# Patient Record
Sex: Female | Born: 1995 | Race: White | Hispanic: No | Marital: Single | Smoking: Never smoker
Health system: Southern US, Community
[De-identification: ages and names within clinical notes are randomized; demographics above are authoritative.]

## PROBLEM LIST (undated history)

## (undated) DIAGNOSIS — F909 Attention-deficit hyperactivity disorder, unspecified type: Secondary | ICD-10-CM

## (undated) DIAGNOSIS — M47817 Spondylosis without myelopathy or radiculopathy, lumbosacral region: Secondary | ICD-10-CM

## (undated) DIAGNOSIS — M533 Sacrococcygeal disorders, not elsewhere classified: Secondary | ICD-10-CM

## (undated) DIAGNOSIS — S332XXA Dislocation of sacroiliac and sacrococcygeal joint, initial encounter: Secondary | ICD-10-CM

## (undated) DIAGNOSIS — M4317 Spondylolisthesis, lumbosacral region: Secondary | ICD-10-CM

## (undated) HISTORY — PX: ORTHOPEDIC SURGERY: SHX850

## (undated) HISTORY — PX: HERNIA REPAIR: SHX51

---
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
## 2016-08-17 IMAGING — CR DG ELBOW 2V*R*
2 series · 2 of 2 positions shown · non-contrast
Comparison: None.

CLINICAL DATA: Pain.  Status post sacroiliac joint pinning.

EXAM:
RIGHT ELBOW - 2 VIEW

[AP]
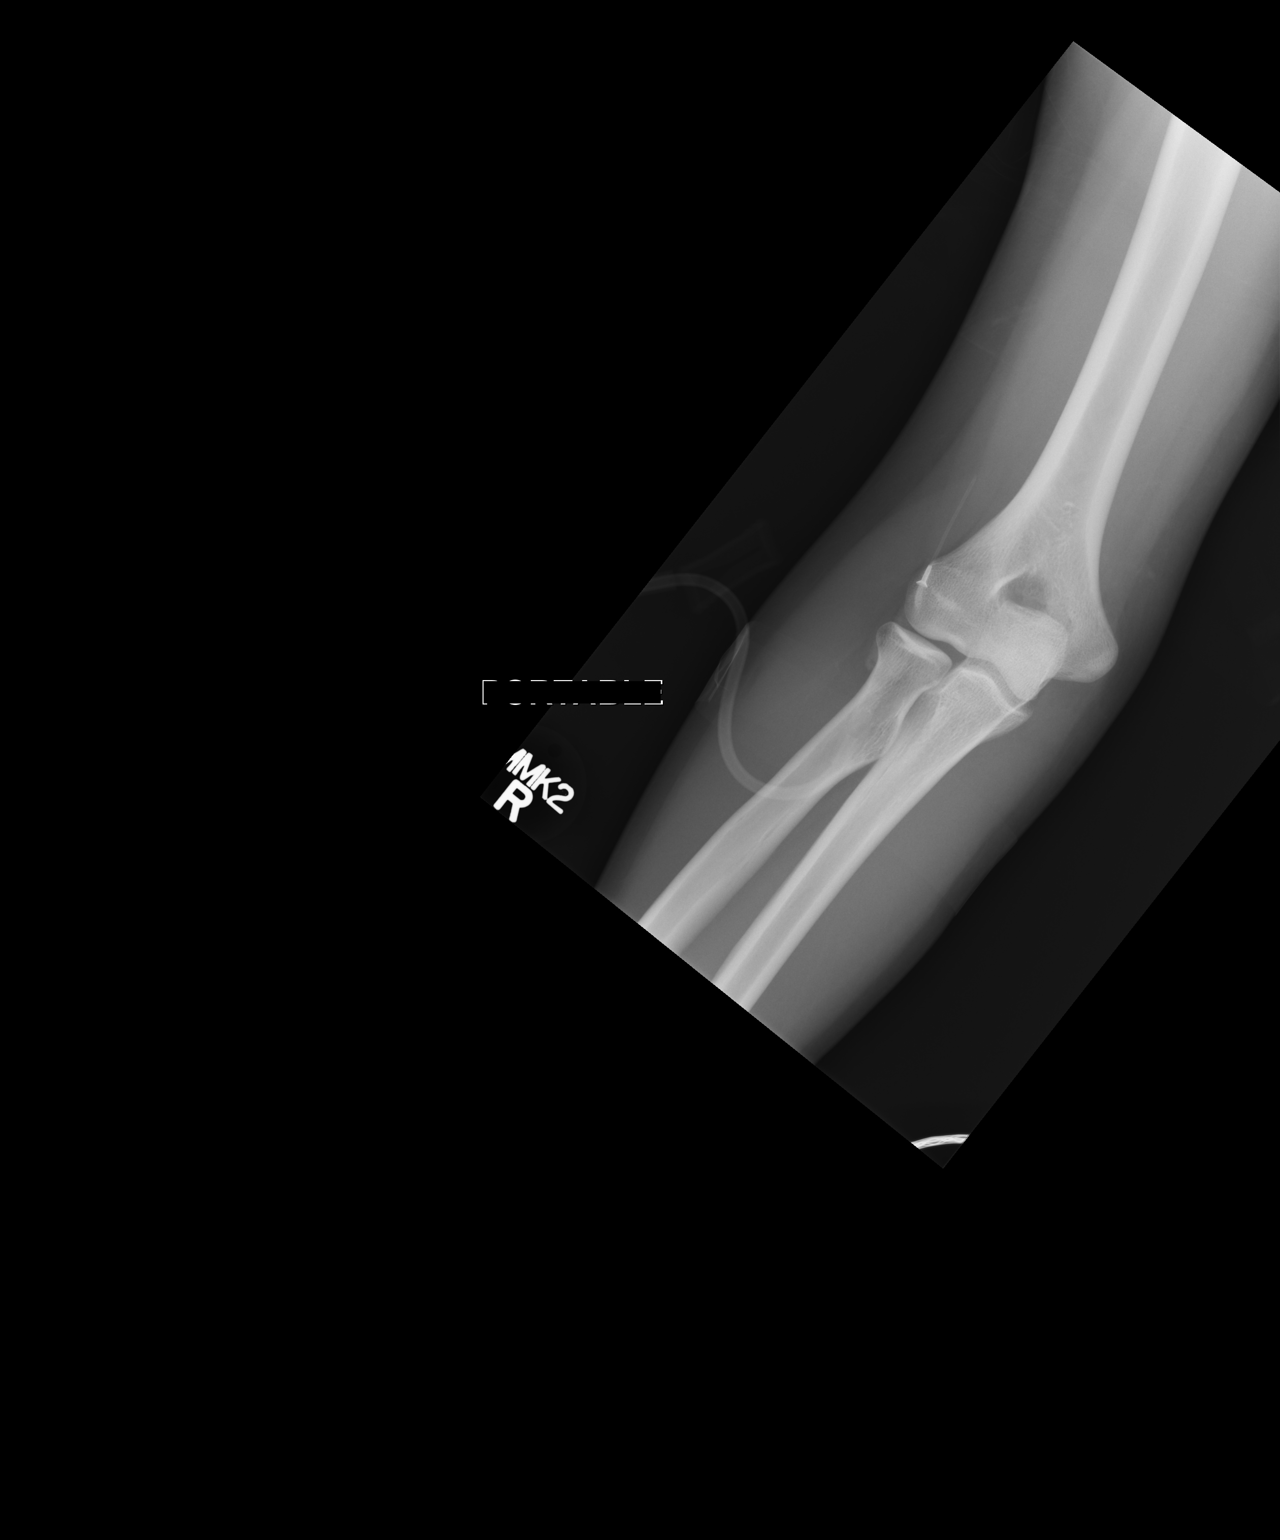

[lateral]
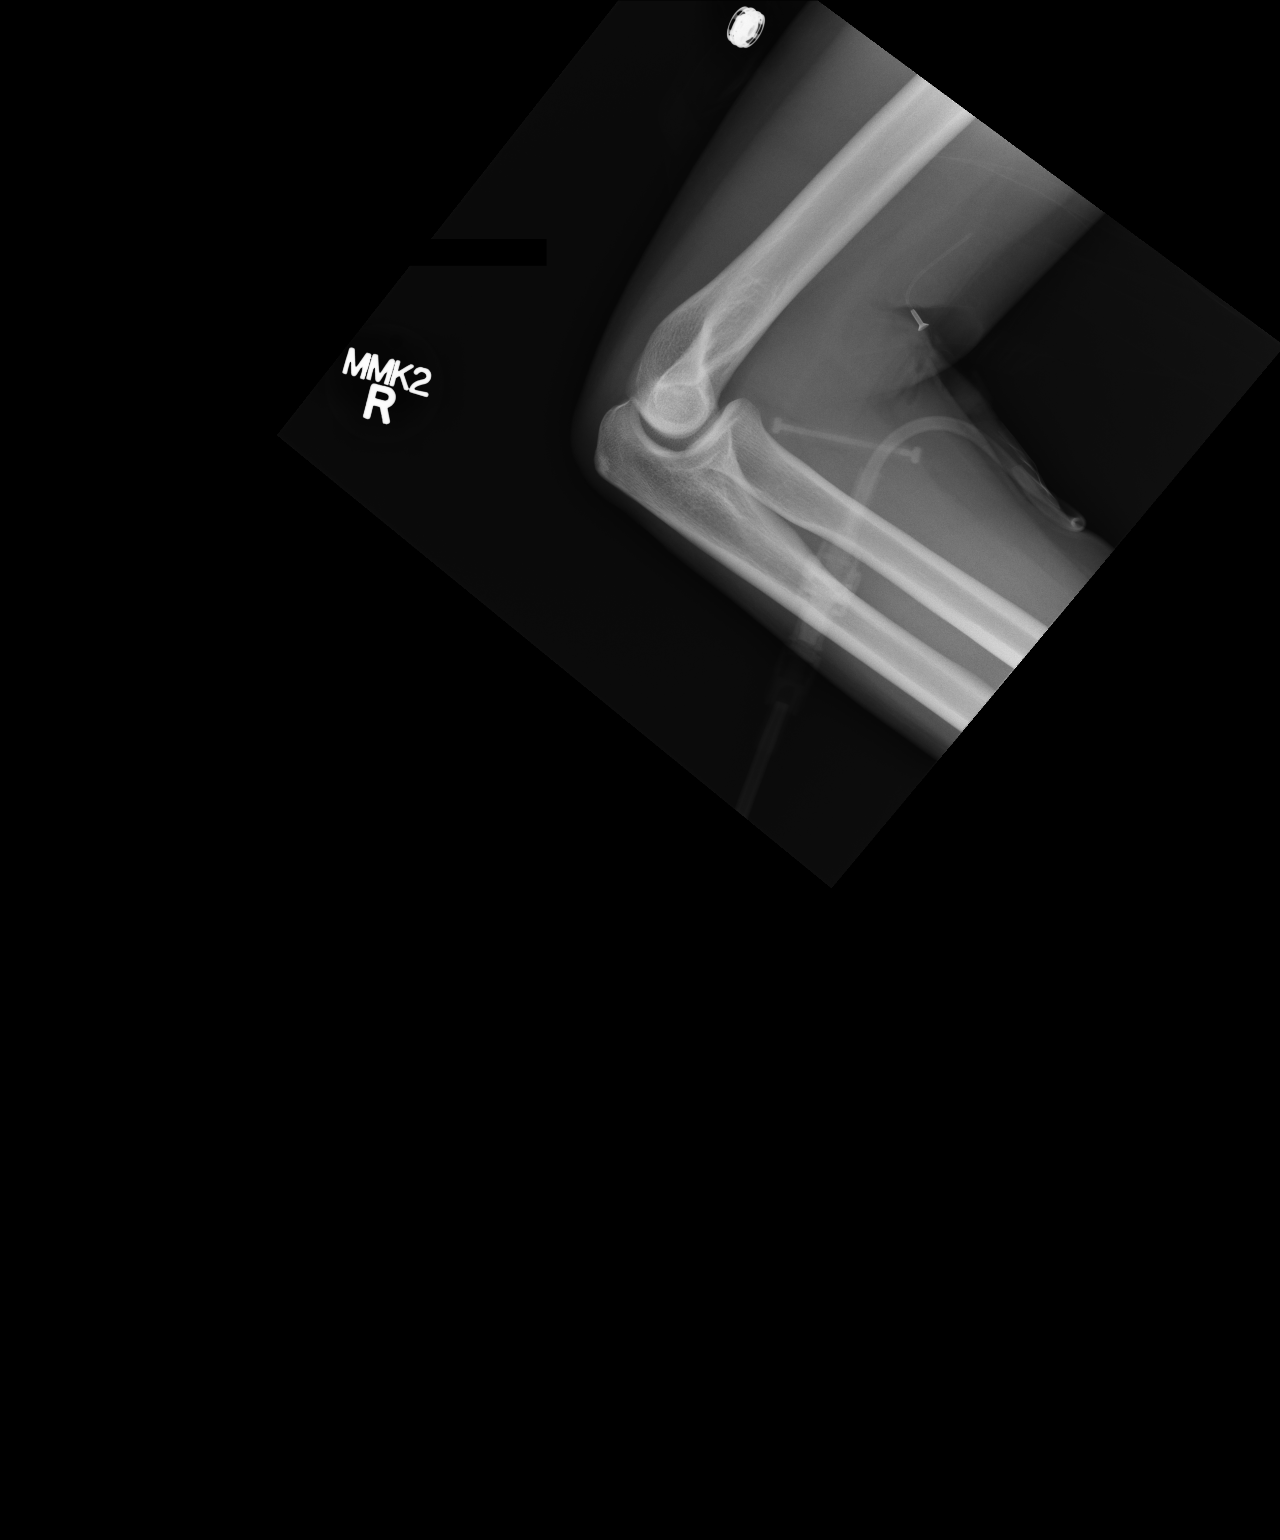

[2 of 2 positions shown; findings below may reference images not displayed]

FINDINGS: There is no evidence of fracture, dislocation, or joint effusion.
There is no evidence of arthropathy or other focal bone abnormality.
Soft tissues are unremarkable.
IMPRESSION: Negative.

## 2016-10-23 ENCOUNTER — Ambulatory Visit: Payer: Medicaid Other | Admitting: Podiatry

## 2016-11-14 ENCOUNTER — Ambulatory Visit (HOSPITAL_COMMUNITY): Payer: BC Managed Care – PPO

## 2016-11-14 ENCOUNTER — Ambulatory Visit (HOSPITAL_COMMUNITY): Payer: BC Managed Care – PPO | Admitting: Anesthesiology

## 2016-11-14 ENCOUNTER — Encounter (HOSPITAL_COMMUNITY): Payer: Self-pay | Admitting: Certified Registered"

## 2016-11-14 ENCOUNTER — Encounter (HOSPITAL_COMMUNITY)
Admission: RE | Disposition: A | Discharge status: Home / Self Care | Payer: Self-pay | Source: Ambulatory Visit | Attending: Orthopedic Surgery

## 2016-11-14 ENCOUNTER — Ambulatory Visit (HOSPITAL_COMMUNITY)
Admission: RE | Admit: 2016-11-14 | Discharge: 2016-11-14 | Disposition: A | Discharge status: Home / Self Care | Payer: BC Managed Care – PPO | Source: Ambulatory Visit | Attending: Orthopedic Surgery | Admitting: Orthopedic Surgery

## 2016-11-14 DIAGNOSIS — F909 Attention-deficit hyperactivity disorder, unspecified type: Secondary | ICD-10-CM | POA: Diagnosis not present

## 2016-11-14 DIAGNOSIS — T8484XA Pain due to internal orthopedic prosthetic devices, implants and grafts, initial encounter: Secondary | ICD-10-CM

## 2016-11-14 DIAGNOSIS — R52 Pain, unspecified: Secondary | ICD-10-CM | POA: Insufficient documentation

## 2016-11-14 DIAGNOSIS — M47817 Spondylosis without myelopathy or radiculopathy, lumbosacral region: Secondary | ICD-10-CM | POA: Diagnosis not present

## 2016-11-14 DIAGNOSIS — Z79899 Other long term (current) drug therapy: Secondary | ICD-10-CM | POA: Diagnosis not present

## 2016-11-14 DIAGNOSIS — Z419 Encounter for procedure for purposes other than remedying health state, unspecified: Secondary | ICD-10-CM

## 2016-11-14 HISTORY — PX: HARDWARE REMOVAL: SHX979

## 2016-11-14 LAB — HCG, SERUM, QUALITATIVE: Preg, Serum: NEGATIVE

## 2016-11-14 SURGERY — REMOVAL, HARDWARE
Anesthesia: General | Laterality: Right

## 2016-11-14 MED ORDER — KETOROLAC TROMETHAMINE 30 MG/ML IJ SOLN
30.0000 mg | Freq: Once | INTRAMUSCULAR | Status: DC
  Filled 2016-11-14: qty 1

## 2016-11-14 MED ORDER — LACTATED RINGERS IV SOLN
INTRAVENOUS | Status: DC
  Administered 2016-11-14: 10:00:00 via INTRAVENOUS

## 2016-11-14 MED ORDER — LIDOCAINE 2% (20 MG/ML) 5 ML SYRINGE
INTRAMUSCULAR | Status: AC
  Filled 2016-11-14: qty 5

## 2016-11-14 MED ORDER — HYDROCODONE-ACETAMINOPHEN 5-325 MG PO TABS: 1.0000 | ORAL_TABLET | Freq: Two times a day (BID) | ORAL | 0 refills | Status: AC | PRN

## 2016-11-14 MED ORDER — CHLORHEXIDINE GLUCONATE 4 % EX LIQD: 60.0000 mL | Freq: Once | CUTANEOUS | Status: DC

## 2016-11-14 MED ORDER — ONDANSETRON HCL 4 MG PO TABS: 4.0000 mg | ORAL_TABLET | Freq: Three times a day (TID) | ORAL | 0 refills | Status: AC | PRN

## 2016-11-14 MED ORDER — LIDOCAINE 2% (20 MG/ML) 5 ML SYRINGE
INTRAMUSCULAR | Status: DC | PRN
  Administered 2016-11-14: 60 mg via INTRAVENOUS

## 2016-11-14 MED ORDER — KETOROLAC TROMETHAMINE 10 MG PO TABS: 10.0000 mg | ORAL_TABLET | Freq: Four times a day (QID) | ORAL | 0 refills | Status: DC | PRN

## 2016-11-14 MED ORDER — DEXAMETHASONE SODIUM PHOSPHATE 10 MG/ML IJ SOLN
INTRAMUSCULAR | Status: DC | PRN
  Administered 2016-11-14: 5 mg via INTRAVENOUS

## 2016-11-14 MED ORDER — MIDAZOLAM HCL 5 MG/5ML IJ SOLN
INTRAMUSCULAR | Status: DC | PRN
Start: 2016-11-14 — End: 2016-11-14
  Administered 2016-11-14: 2 mg via INTRAVENOUS

## 2016-11-14 MED ORDER — MIDAZOLAM HCL 2 MG/2ML IJ SOLN
INTRAMUSCULAR | Status: AC
  Filled 2016-11-14: qty 2

## 2016-11-14 MED ORDER — ONDANSETRON HCL 4 MG/2ML IJ SOLN
INTRAMUSCULAR | Status: AC
  Filled 2016-11-14: qty 2

## 2016-11-14 MED ORDER — PROPOFOL 10 MG/ML IV BOLUS
INTRAVENOUS | Status: DC | PRN
  Administered 2016-11-14: 150 mg via INTRAVENOUS
  Administered 2016-11-14: 50 mg via INTRAVENOUS

## 2016-11-14 MED ORDER — HYDROMORPHONE HCL 1 MG/ML IJ SOLN
INTRAMUSCULAR | Status: AC
  Administered 2016-11-14: 0.25 mg via INTRAVENOUS
  Filled 2016-11-14: qty 1

## 2016-11-14 MED ORDER — PROPOFOL 10 MG/ML IV BOLUS
INTRAVENOUS | Status: AC
  Filled 2016-11-14: qty 20

## 2016-11-14 MED ORDER — KETOROLAC TROMETHAMINE 10 MG PO TABS: 10.0000 mg | ORAL_TABLET | Freq: Four times a day (QID) | ORAL | 0 refills | Status: AC | PRN

## 2016-11-14 MED ORDER — DEXAMETHASONE SODIUM PHOSPHATE 10 MG/ML IJ SOLN
INTRAMUSCULAR | Status: AC
  Filled 2016-11-14: qty 1

## 2016-11-14 MED ORDER — POVIDONE-IODINE 10 % EX SWAB: 2.0000 "application " | Freq: Once | CUTANEOUS | Status: DC

## 2016-11-14 MED ORDER — ONDANSETRON HCL 4 MG/2ML IJ SOLN
INTRAMUSCULAR | Status: DC | PRN
  Administered 2016-11-14: 4 mg via INTRAVENOUS

## 2016-11-14 MED ORDER — ONDANSETRON HCL 4 MG PO TABS: 4.0000 mg | ORAL_TABLET | Freq: Three times a day (TID) | ORAL | 0 refills | Status: DC | PRN

## 2016-11-14 MED ORDER — HYDROCODONE-ACETAMINOPHEN 5-325 MG PO TABS: 1.0000 | ORAL_TABLET | Freq: Two times a day (BID) | ORAL | 0 refills | Status: DC | PRN

## 2016-11-14 MED ORDER — PROMETHAZINE HCL 25 MG/ML IJ SOLN: 6.2500 mg | INTRAMUSCULAR | Status: DC | PRN

## 2016-11-14 MED ORDER — HYDROMORPHONE HCL 1 MG/ML IJ SOLN
0.2500 mg | INTRAMUSCULAR | Status: DC | PRN
  Administered 2016-11-14 (×2): 0.25 mg via INTRAVENOUS

## 2016-11-14 MED ORDER — PHENYLEPHRINE 40 MCG/ML (10ML) SYRINGE FOR IV PUSH (FOR BLOOD PRESSURE SUPPORT)
PREFILLED_SYRINGE | INTRAVENOUS | Status: DC | PRN
  Administered 2016-11-14: 80 ug via INTRAVENOUS

## 2016-11-14 MED ORDER — CEFAZOLIN SODIUM-DEXTROSE 2-4 GM/100ML-% IV SOLN
2.0000 g | INTRAVENOUS | Status: AC
  Administered 2016-11-14: 2 g via INTRAVENOUS
  Filled 2016-11-14: qty 100

## 2016-11-14 MED ORDER — 0.9 % SODIUM CHLORIDE (POUR BTL) OPTIME
TOPICAL | Status: DC | PRN
  Administered 2016-11-14: 1000 mL

## 2016-11-14 MED ORDER — FENTANYL CITRATE (PF) 250 MCG/5ML IJ SOLN
INTRAMUSCULAR | Status: AC
  Filled 2016-11-14: qty 5

## 2016-11-14 MED ORDER — PHENYLEPHRINE 40 MCG/ML (10ML) SYRINGE FOR IV PUSH (FOR BLOOD PRESSURE SUPPORT)
PREFILLED_SYRINGE | INTRAVENOUS | Status: AC
  Filled 2016-11-14: qty 10

## 2016-11-14 SURGICAL SUPPLY — 60 items
BANDAGE ACE 4X5 VEL STRL LF (GAUZE/BANDAGES/DRESSINGS) ×3 IMPLANT
BANDAGE ACE 6X5 VEL STRL LF (GAUZE/BANDAGES/DRESSINGS) ×3 IMPLANT
BANDAGE ESMARK 6X9 LF (GAUZE/BANDAGES/DRESSINGS) ×1 IMPLANT
BNDG CMPR 9X6 STRL LF SNTH (GAUZE/BANDAGES/DRESSINGS) ×1
BNDG COHESIVE 6X5 TAN STRL LF (GAUZE/BANDAGES/DRESSINGS) ×3 IMPLANT
BNDG ESMARK 6X9 LF (GAUZE/BANDAGES/DRESSINGS) ×3
BNDG GAUZE ELAST 4 BULKY (GAUZE/BANDAGES/DRESSINGS) ×6 IMPLANT
BRUSH SCRUB SURG 4.25 DISP (MISCELLANEOUS) ×6 IMPLANT
CLOSURE WOUND 1/2 X4 (GAUZE/BANDAGES/DRESSINGS)
COVER SURGICAL LIGHT HANDLE (MISCELLANEOUS) ×6 IMPLANT
CUFF TOURNIQUET SINGLE 18IN (TOURNIQUET CUFF) IMPLANT
CUFF TOURNIQUET SINGLE 24IN (TOURNIQUET CUFF) IMPLANT
CUFF TOURNIQUET SINGLE 34IN LL (TOURNIQUET CUFF) IMPLANT
DRAPE C-ARM 42X72 X-RAY (DRAPES) IMPLANT
DRAPE C-ARMOR (DRAPES) ×3 IMPLANT
DRAPE U-SHAPE 47X51 STRL (DRAPES) ×3 IMPLANT
DRSG ADAPTIC 3X8 NADH LF (GAUZE/BANDAGES/DRESSINGS) ×3 IMPLANT
DRSG MEPILEX BORDER 4X4 (GAUZE/BANDAGES/DRESSINGS) ×2 IMPLANT
ELECT REM PT RETURN 9FT ADLT (ELECTROSURGICAL) ×3
ELECTRODE REM PT RTRN 9FT ADLT (ELECTROSURGICAL) ×1 IMPLANT
GAUZE SPONGE 4X4 12PLY STRL (GAUZE/BANDAGES/DRESSINGS) ×3 IMPLANT
GLOVE BIO SURGEON STRL SZ7.5 (GLOVE) ×3 IMPLANT
GLOVE BIO SURGEON STRL SZ8 (GLOVE) ×3 IMPLANT
GLOVE BIOGEL PI IND STRL 7.5 (GLOVE) ×1 IMPLANT
GLOVE BIOGEL PI IND STRL 8 (GLOVE) ×1 IMPLANT
GLOVE BIOGEL PI INDICATOR 7.5 (GLOVE) ×2
GLOVE BIOGEL PI INDICATOR 8 (GLOVE) ×2
GOWN STRL REUS W/ TWL LRG LVL3 (GOWN DISPOSABLE) ×2 IMPLANT
GOWN STRL REUS W/ TWL XL LVL3 (GOWN DISPOSABLE) ×1 IMPLANT
GOWN STRL REUS W/TWL LRG LVL3 (GOWN DISPOSABLE) ×6
GOWN STRL REUS W/TWL XL LVL3 (GOWN DISPOSABLE) ×3
GUIDEWIRE THREADED 2.8MM (WIRE) ×2 IMPLANT
KIT BASIN OR (CUSTOM PROCEDURE TRAY) ×3 IMPLANT
KIT ROOM TURNOVER OR (KITS) ×3 IMPLANT
MANIFOLD NEPTUNE II (INSTRUMENTS) ×3 IMPLANT
NEEDLE 22X1 1/2 (OR ONLY) (NEEDLE) IMPLANT
NS IRRIG 1000ML POUR BTL (IV SOLUTION) ×3 IMPLANT
PACK ORTHO EXTREMITY (CUSTOM PROCEDURE TRAY) ×3 IMPLANT
PAD ARMBOARD 7.5X6 YLW CONV (MISCELLANEOUS) ×6 IMPLANT
PADDING CAST COTTON 6X4 STRL (CAST SUPPLIES) ×9 IMPLANT
SPONGE LAP 18X18 X RAY DECT (DISPOSABLE) ×3 IMPLANT
STAPLER VISISTAT 35W (STAPLE) IMPLANT
STOCKINETTE IMPERVIOUS LG (DRAPES) ×3 IMPLANT
STRIP CLOSURE SKIN 1/2X4 (GAUZE/BANDAGES/DRESSINGS) IMPLANT
SUCTION FRAZIER HANDLE 10FR (MISCELLANEOUS)
SUCTION TUBE FRAZIER 10FR DISP (MISCELLANEOUS) IMPLANT
SUT ETHILON 3 0 PS 1 (SUTURE) IMPLANT
SUT PDS AB 2-0 CT1 27 (SUTURE) IMPLANT
SUT VIC AB 0 CT1 27 (SUTURE)
SUT VIC AB 0 CT1 27XBRD ANBCTR (SUTURE) IMPLANT
SUT VIC AB 2-0 CT1 27 (SUTURE)
SUT VIC AB 2-0 CT1 TAPERPNT 27 (SUTURE) IMPLANT
SYR CONTROL 10ML LL (SYRINGE) IMPLANT
TOWEL OR 17X24 6PK STRL BLUE (TOWEL DISPOSABLE) ×6 IMPLANT
TOWEL OR 17X26 10 PK STRL BLUE (TOWEL DISPOSABLE) ×6 IMPLANT
TUBE CONNECTING 12'X1/4 (SUCTIONS) ×1
TUBE CONNECTING 12X1/4 (SUCTIONS) ×2 IMPLANT
UNDERPAD 30X30 (UNDERPADS AND DIAPERS) ×3 IMPLANT
WATER STERILE IRR 1000ML POUR (IV SOLUTION) ×6 IMPLANT
YANKAUER SUCT BULB TIP NO VENT (SUCTIONS) ×3 IMPLANT

## 2016-11-14 NOTE — Anesthesia Preprocedure Evaluation (Addendum)
Anesthesia Evaluation  Patient identified by MRN, date of birth, ID band Patient awake    Reviewed: Allergy & Precautions, H&P , NPO status , Patient's Chart, lab work & pertinent test results  Airway Mallampati: II  TM Distance: >3 FB Neck ROM: Full    Dental no notable dental hx. (+) Teeth Intact, Dental Advisory Given   Pulmonary neg pulmonary ROS,    Pulmonary exam normal breath sounds clear to auscultation       Cardiovascular negative cardio ROS   Rhythm:Regular Rate:Normal     Neuro/Psych negative neurological ROS  negative psych ROS   GI/Hepatic negative GI ROS, Neg liver ROS,   Endo/Other  negative endocrine ROS  Renal/GU negative Renal ROS  negative genitourinary   Musculoskeletal   Abdominal   Peds  (+) ADHD Hematology negative hematology ROS (+)   Anesthesia Other Findings   Reproductive/Obstetrics negative OB ROS                             Anesthesia Physical  Anesthesia Plan  ASA: I  Anesthesia Plan: General   Post-op Pain Management:    Induction: Intravenous  PONV Risk Score and Plan: 3 and Ondansetron, Dexamethasone and Midazolam  Airway Management Planned: LMA  Additional Equipment:   Intra-op Plan:   Post-operative Plan: Extubation in OR  Informed Consent: I have reviewed the patients History and Physical, chart, labs and discussed the procedure including the risks, benefits and alternatives for the proposed anesthesia with the patient or authorized representative who has indicated his/her understanding and acceptance.   Dental advisory given  Plan Discussed with: CRNA, Anesthesiologist and Surgeon  Anesthesia Plan Comments:      Anesthesia Quick Evaluation

## 2016-11-14 NOTE — Transfer of Care (Signed)
Immediate Anesthesia Transfer of Care Note  Patient: Alicia Marshall  Procedure(s) Performed: Procedure(s): HARDWARE REMOVAL SACRO-ILIAC SCREW RIGHT SIDE (Right)  Patient Location: PACU  Anesthesia Type:General  Level of Consciousness: drowsy  Airway & Oxygen Therapy: Patient Spontanous Breathing and Patient connected to nasal cannula oxygen  Post-op Assessment: Report given to RN, Post -op Vital signs reviewed and stable and Patient moving all extremities  Post vital signs: Reviewed and stable  Last Vitals:  Vitals:   11/14/16 0919 11/14/16 1112  BP: 112/80   Pulse: 65   Resp: 18   Temp: 36.4 C (!) (P) 36.3 C    Last Pain:  Vitals:   11/14/16 1112  TempSrc:   PainSc: (P) 0-No pain      Patients Stated Pain Goal: 0 (11/14/16 0932)  Complications: No apparent anesthesia complications

## 2016-11-14 NOTE — H&P (Signed)
[facility] H&P/Consult     Chief Complaint: Retained, symptomatic right sacroiliac screw HPI: Alicia Marshall is an 21 y.o. female.s/p repair of pelvic ring instability. Requests removal to reduce pain and later arthrosis.   Past Medical History:  Diagnosis Date  . ADHD (attention deficit hyperactivity disorder)   . Attention deficit hyperactivity disorder (ADHD) 03/05/2015  . Disorder of SI (sacroiliac) joint, Right  03/05/2015  . Lumbosacral spondylosis without myelopathy 03/05/2015  . SI joint dislocation, right 03/05/2015  . Spondylolisthesis at L5-S1 level 03/05/2015    Past Surgical History:  Procedure Laterality Date  . HERNIA REPAIR    . HERNIA REPAIR    . ORTHOPEDIC SURGERY     bunion surgery   . SACRO-ILIAC PINNING Right 03/04/2015   Procedure: Loyal GamblerSACRO-ILIAC PINNING;  Surgeon: Myrene GalasMichael Lycan Davee, MD;  [facility];  Service: Orthopedics;  Laterality: Right;    History reviewed. No pertinent family history. Social History:  reports that she has never smoked. She has never used smokeless tobacco. She reports that she does not drink alcohol or use drugs.  Allergies: No Known Allergies  Medications Prior to Admission  Medication Sig Dispense Refill  . cloNIDine (CATAPRES) 0.1 MG tablet Take 0.2 mg by mouth at bedtime.  2  . methylphenidate 36 MG PO CR tablet Take 72 mg by mouth daily.                        No results found for this or any previous visit (from the past 48 hour(s)). No results found.  ROS No recent fever, bleeding abnormalities, urologic dysfunction, GI problems, or weight gain.  Blood pressure 112/80, pulse 65, temperature 97.6 F (36.4 C), temperature source Oral, resp. rate 18, height 5\' 2"  (1.575 m), weight 51.7 kg (114 lb), SpO2 100 %. Physical Exam NCAT RRR CTA S/NT/ND R&LLE   No edema/ swelling controlled  Sens: DPN, SPN, TN intact  Motor: EHL, FHL, and lessor toe ext and flex all intact  DP  2+   Assessment/Plan Retained, symptomatic right sacroiliac screw  I discussed with the patient the risks and benefits of surgery, including the possibility of infection, nerve injury, vessel injury, wound breakdown, arthritis, failure to alleviate symptoms, DVT/ PE, loss of motion, and need for further surgery among others.  She acknowledged these risks and wished to proceed.    Myrene GalasMichael Angelica Frandsen, MD [facility] [phone] (p)  11/14/2016, 9:54 AM

## 2016-11-14 NOTE — Anesthesia Procedure Notes (Signed)
Procedure Name: LMA Insertion Date/Time: 11/14/2016 10:27 AM Performed by: Charm BargesBUTLER, Vannia Pola R Pre-anesthesia Checklist: Patient identified, Emergency Drugs available, Suction available and Patient being monitored Patient Re-evaluated:Patient Re-evaluated prior to induction Oxygen Delivery Method: Circle System Utilized Preoxygenation: Pre-oxygenation with 100% oxygen Induction Type: IV induction Ventilation: Mask ventilation without difficulty LMA: LMA inserted LMA Size: 4.0 Number of attempts: 1 Placement Confirmation: positive ETCO2 Tube secured with: Tape Dental Injury: Teeth and Oropharynx as per pre-operative assessment

## 2016-11-14 NOTE — Anesthesia Postprocedure Evaluation (Signed)
Anesthesia Post Note  Patient: Alicia Marshall  Procedure(s) Performed: Procedure(s) (LRB): HARDWARE REMOVAL SACRO-ILIAC SCREW RIGHT SIDE (Right)     Patient location during evaluation: PACU Anesthesia Type: General Level of consciousness: awake and alert Pain management: pain level controlled Vital Signs Assessment: post-procedure vital signs reviewed and stable Respiratory status: spontaneous breathing, nonlabored ventilation, respiratory function stable and patient connected to nasal cannula oxygen Cardiovascular status: blood pressure returned to baseline and stable Postop Assessment: no signs of nausea or vomiting Anesthetic complications: no    Last Vitals:  Vitals:   11/14/16 1131 11/14/16 1139  BP: (!) 119/92 (!) 120/99  Pulse: 85 76  Resp: 17 16  Temp:      Last Pain:  Vitals:   11/14/16 1141  TempSrc:   PainSc: 5                  Kennieth RadFitzgerald, Batoul Limes E

## 2016-11-14 NOTE — Discharge Instructions (Signed)
[facility] Discharge Instructions   General Discharge Instructions  WEIGHT BEARING STATUS: weightbearing as tolerated   RANGE OF MOTION/ACTIVITY:range of motion as tolerated  Wound Care: change dressing on 11/16/2016. Can leave uncovered once there is no drainage. Ok to shower   PAIN MEDICATION USE AND EXPECTATIONS  You have likely been given narcotic medications to help control your pain.  After a traumatic event that results in an fracture (broken bone) with or without surgery, it is ok to use narcotic pain medications to help control one's pain.  We understand that everyone responds to pain differently and each individual patient will be evaluated on a regular basis for the continued need for narcotic medications. Ideally, narcotic medication use should last no more than 6-8 weeks (coinciding with fracture healing).   As a patient it is your responsibility as well to monitor narcotic medication use and report the amount and frequency you use these medications when you come to your office visit.   We would also advise that if you are using narcotic medications, you should take a dose prior to therapy to maximize you participation.  IF YOU ARE ON NARCOTIC MEDICATIONS IT IS NOT PERMISSIBLE TO OPERATE A MOTOR VEHICLE (MOTORCYCLE/CAR/TRUCK/MOPED) OR HEAVY MACHINERY DO NOT MIX NARCOTICS WITH OTHER CNS (CENTRAL NERVOUS SYSTEM) DEPRESSANTS SUCH AS ALCOHOL  Diet: as you were eating previously.  Can use over the counter stool softeners and bowel preparations, such as Miralax, to help with bowel movements.  Narcotics can be constipating.  Be sure to drink plenty of fluids    STOP SMOKING OR USING NICOTINE PRODUCTS!!!!  As discussed nicotine severely impairs your body's ability to heal surgical and traumatic wounds but also impairs bone healing.  Wounds and bone heal by forming microscopic blood vessels (angiogenesis) and nicotine is a vasoconstrictor (essentially, shrinks blood  vessels).  Therefore, if vasoconstriction occurs to these microscopic blood vessels they essentially disappear and are unable to deliver necessary nutrients to the healing tissue.  This is one modifiable factor that you can do to dramatically increase your chances of healing your injury.    (This means no smoking, no nicotine gum, patches, etc)  DO NOT USE NONSTEROIDAL ANTI-INFLAMMATORY DRUGS (NSAID'S)  Using products such as Advil (ibuprofen), Aleve (naproxen), Motrin (ibuprofen) for additional pain control during fracture healing can delay and/or prevent the healing response.  If you would like to take over the counter (OTC) medication, Tylenol (acetaminophen) is ok.  However, some narcotic medications that are given for pain control contain acetaminophen as well. Therefore, you should not exceed more than 4000 mg of tylenol in a day if you do not have liver disease.  Also note that there are may OTC medicines, such as cold medicines and allergy medicines that my contain tylenol as well.  If you have any questions about medications and/or interactions please ask your doctor/PA or your pharmacist.      ICE AND ELEVATE INJURED/OPERATIVE EXTREMITY  Using ice and elevating the injured extremity above your heart can help with swelling and pain control.  Icing in a pulsatile fashion, such as 20 minutes on and 20 minutes off, can be followed.    Do not place ice directly on skin. Make sure there is a barrier between to skin and the ice pack.    Using frozen items such as frozen peas works well as the conform nicely to the are that needs to be iced.  USE AN ACE WRAP OR TED HOSE FOR SWELLING CONTROL  In addition to icing and elevation, Ace wraps or TED hose are used to help limit and resolve swelling.  It is recommended to use Ace wraps or TED hose until you are informed to stop.    When using Ace Wraps start the wrapping distally (farthest away from the body) and wrap proximally (closer to the  body)   Example: If you had surgery on your leg or thing and you do not have a splint on, start the ace wrap at the toes and work your way up to the thigh        If you had surgery on your upper extremity and do not have a splint on, start the ace wrap at your fingers and work your way up to the upper arm  IF YOU ARE IN A SPLINT OR CAST DO NOT REMOVE IT FOR ANY REASON   If your splint gets wet for any reason please contact the office immediately. You may shower in your splint or cast as long as you keep it dry.  This can be done by wrapping in a cast cover or garbage back (or similar)  Do Not stick any thing down your splint or cast such as pencils, money, or hangers to try and scratch yourself with.  If you feel itchy take benadryl as prescribed on the bottle for itching  IF YOU ARE IN A CAM BOOT (BLACK BOOT)  You may remove boot periodically. Perform daily dressing changes as noted below.  Wash the liner of the boot regularly and wear a sock when wearing the boot. It is recommended that you sleep in the boot until told otherwise  CALL THE OFFICE WITH ANY QUESTIONS OR CONCERNS: [phone]        Discharge Pin Site Instructions  Dress pins daily with Kerlix roll starting on POD 2. Wrap the Kerlix so that it tamps the skin down around the pin-skin interface to prevent/limit motion of the skin relative to the pin.  (Pin-skin motion is the primary cause of pain and infection related to external fixator pin sites).  Remove any crust or coagulum that may obstruct drainage with a saline moistened gauze or soap and water.  After POD 3, if there is no discernable drainage on the pin site dressing, the interval for change can by increased to every other day.  You may shower with the fixator, cleaning all pin sites gently with soap and water.  If you have a surgical wound this needs to be completely dry and without drainage before showering.  The extremity can be lifted by the fixator to  facilitate wound care and transfers.  Notify the office/Doctor if you experience increasing drainage, redness, or pain from a pin site, or if you notice purulent (thick, snot-like) drainage.  Discharge Wound Care Instructions  Do NOT apply any ointments, solutions or lotions to pin sites or surgical wounds.  These prevent needed drainage and even though solutions like hydrogen peroxide kill bacteria, they also damage cells lining the pin sites that help fight infection.  Applying lotions or ointments can keep the wounds moist and can cause them to breakdown and open up as well. This can increase the risk for infection. When in doubt call the office.  Surgical incisions should be dressed daily.  If any drainage is noted, use one layer of adaptic, then gauze, Kerlix, and an ace wrap.  Once the incision is completely dry and without drainage, it may be left open to air out.  Showering  may begin 36-48 hours later.  Cleaning gently with soap and water.  Traumatic wounds should be dressed daily as well.    One layer of adaptic, gauze, Kerlix, then ace wrap.  The adaptic can be discontinued once the draining has ceased    If you have a wet to dry dressing: wet the gauze with saline the squeeze as much saline out so the gauze is moist (not soaking wet), place moistened gauze over wound, then place a dry gauze over the moist one, followed by Kerlix wrap, then ace wrap.

## 2016-11-14 NOTE — Brief Op Note (Signed)
11/14/2016  11:10 AM  PATIENT:  Alicia Marshall  21 y.o. female  PRE-OPERATIVE DIAGNOSIS:  Symptomatic right sacroiliac screw  POST-OPERATIVE DIAGNOSIS:  Symptomatic right sacroiliac screw  PROCEDURE:  Procedure(s): HARDWARE REMOVAL SACRO-ILIAC SCREW RIGHT SIDE (Right)  SURGEON:  Surgeon(s) and Role:    Myrene Galas* Crystelle Ferrufino, MD - Primary  PHYSICIAN ASSISTANT: none  ANESTHESIA:   none  EBL:  Total I/O In: 500 [I.V.:500] Out: 5 [Blood:5]  BLOOD ADMINISTERED:none  DRAINS: none   LOCAL MEDICATIONS USED:  NONE  SPECIMEN:  No Specimen  DISPOSITION OF SPECIMEN:  N/A  COUNTS:  YES  TOURNIQUET:  * No tourniquets in log *  DICTATION: .Other Dictation: Dictation Number [id number]  PLAN OF CARE: Discharge to home after PACU  PATIENT DISPOSITION:  PACU - hemodynamically stable.   Delay start of Pharmacological VTE agent (>24hrs) due to surgical blood loss or risk of bleeding: no

## 2016-11-15 ENCOUNTER — Encounter (HOSPITAL_COMMUNITY): Payer: Self-pay | Admitting: Orthopedic Surgery

## 2016-11-15 NOTE — Op Note (Signed)
NAME:  Alicia Marshall, Achaia                     ACCOUNT NO.:  MEDICAL RECORD NO.:  [id number]  LOCATION:                                 FACILITY:  PHYSICIAN:  Doralee AlbinoMichael H. Carola FrostHandy, M.D.      DATE OF BIRTH:  DATE OF PROCEDURE:  11/14/2016 DATE OF DISCHARGE:                              OPERATIVE REPORT   PREOPERATIVE DIAGNOSIS:  Symptomatic hardware, right sacroiliac screw.  POSTOPERATIVE DIAGNOSIS:  Symptomatic hardware, right sacroiliac screw.  PROCEDURE:  Removal of right SI screw.  SURGEON:  Doralee AlbinoMichael H. Carola FrostHandy, M.D.  ASSISTANT:  None.  ANESTHESIA:  General.  COMPLICATIONS:  None.  DISPOSITION:  To PACU.  CONDITION:  Stable.  BRIEF SUMMARY OF INDICATIONS FOR PROCEDURE:  Alicia ShanHannah Sellen is a very pleasant 21 year old female with an unstable pelvic ring fracture, undergone right SI screw fixation.  She had some right-sided complaints following healing and we discussed removal of the screw prior to either breakage of bone overgrowth with the risks including infection, nerve injury, vessel injury, failure to alleviate symptoms, need for further surgery among others.  She acknowledged these risks and strongly wished to proceed.  BRIEF SUMMARY OF PROCEDURE:  Dahlia ClientHannah was taken to the operating room where general anesthesia was induced.  She was positioned supine on a radiolucent table.  A towel bump was placed under her sacrum given her thin build.  After a chlorhexidine scrub, Betadine scrub and paint, the C-arm was brought in.  The old incision was remade.  The guidewire was advanced down to the level of the screw and then into the cannulated portion with x-ray assistance.  The screw was then withdrawn without complication.  Final image showed no change in position of the washer as anticipated.  Wound was irrigated thoroughly and then closed with 2 simple 3-0 nylon sutures.  The patient was taken to PACU in stable condition after application of sterile gently compressive  dressing.  PROGNOSIS:  Dahlia ClientHannah will be weightbearing as tolerated with unrestricted weightbearing and activity in a graduated fashion.  We will plan to see her back in 10 to 14 days for removal of her sutures.     Doralee AlbinoMichael H. Carola FrostHandy, M.D.     M[facility]/MEDQ  D:  11/14/2016  T:  11/14/2016  Job:  [id number]

## 2017-02-01 IMAGING — DX DG CHEST 2V
2 series · 2 of 2 positions shown · non-contrast
Comparison: 06/19/2009

CLINICAL DATA: Chest pain and tightness since 8 p.m..  Nonsmoker.

EXAM:
CHEST  2 VIEW

[w chest pa]
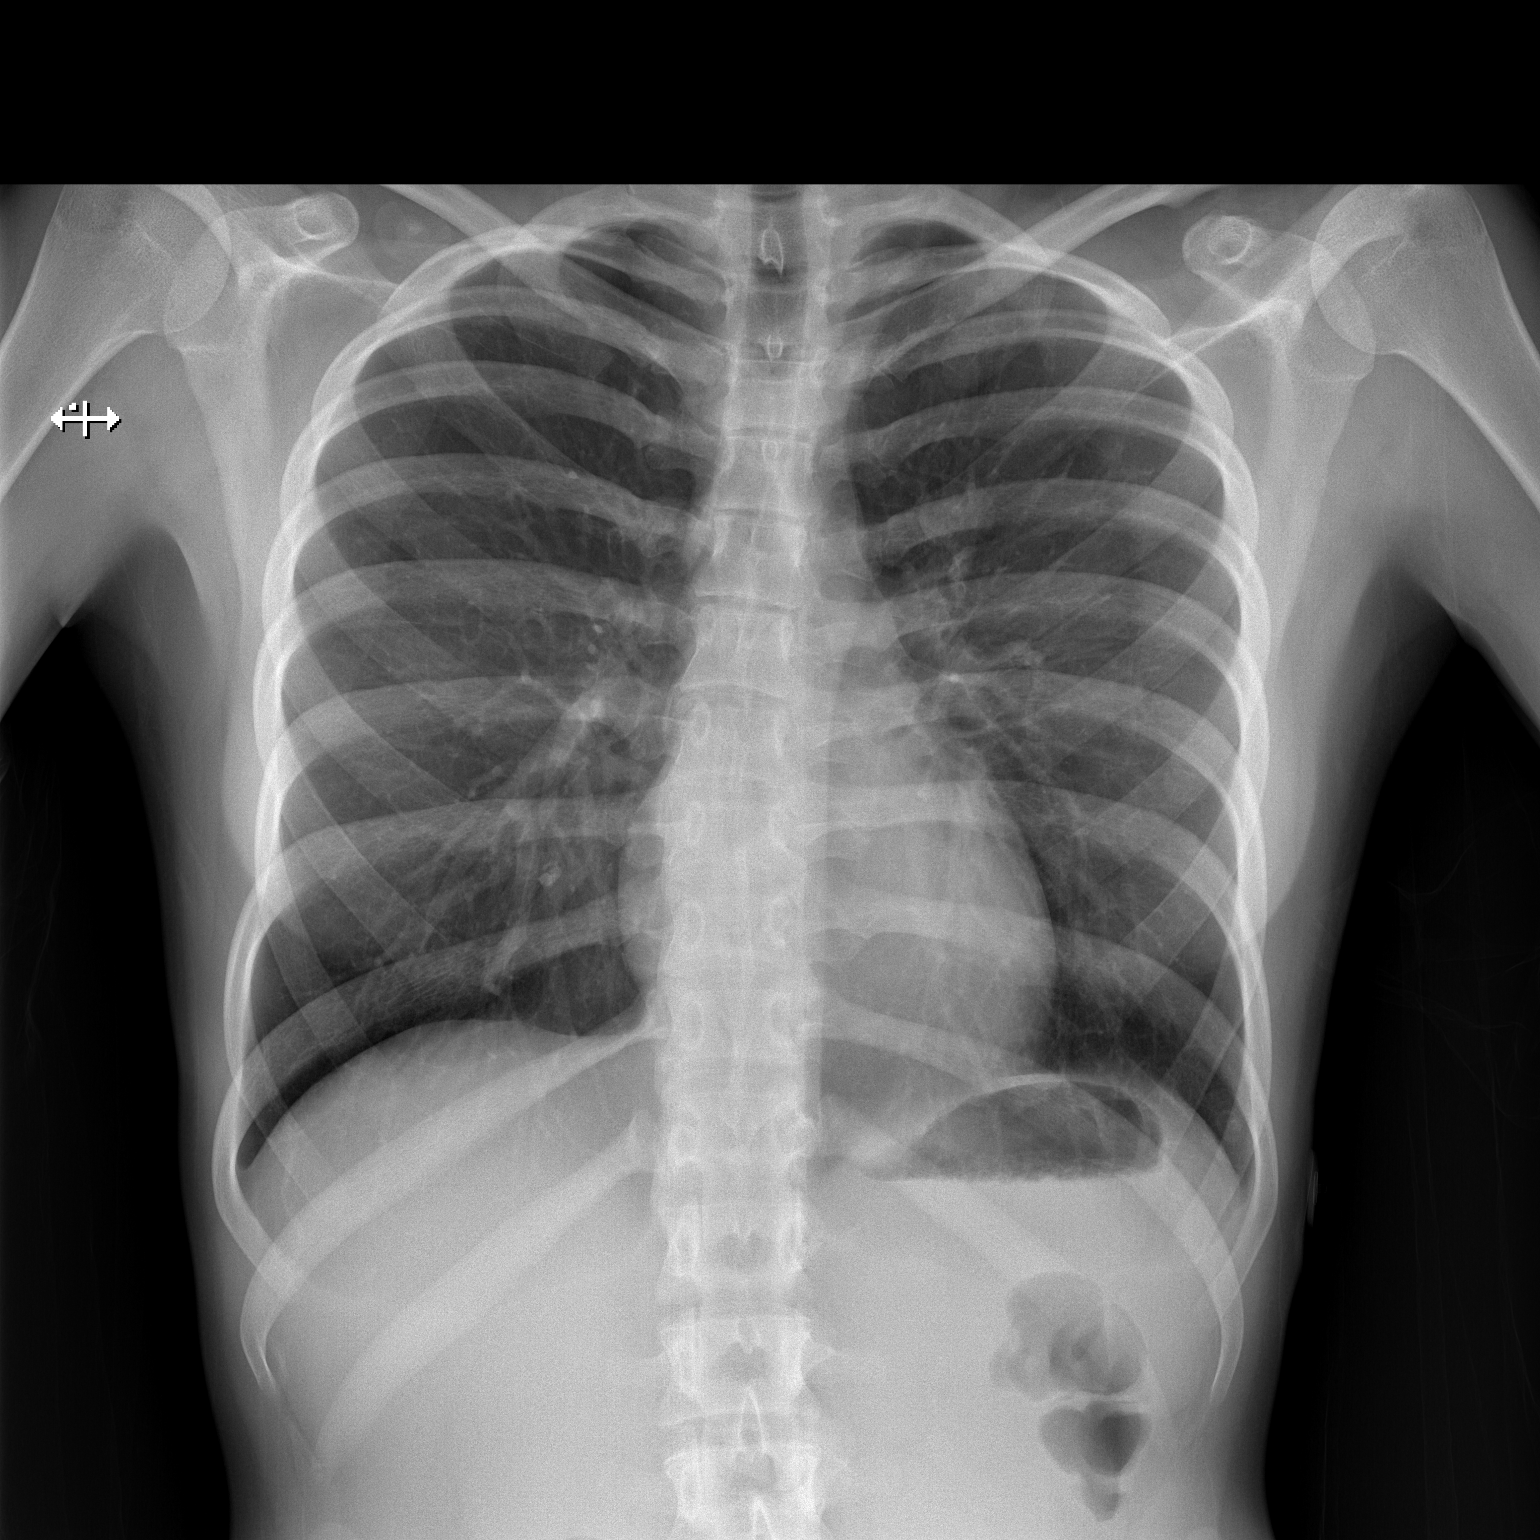

[w chest lat]
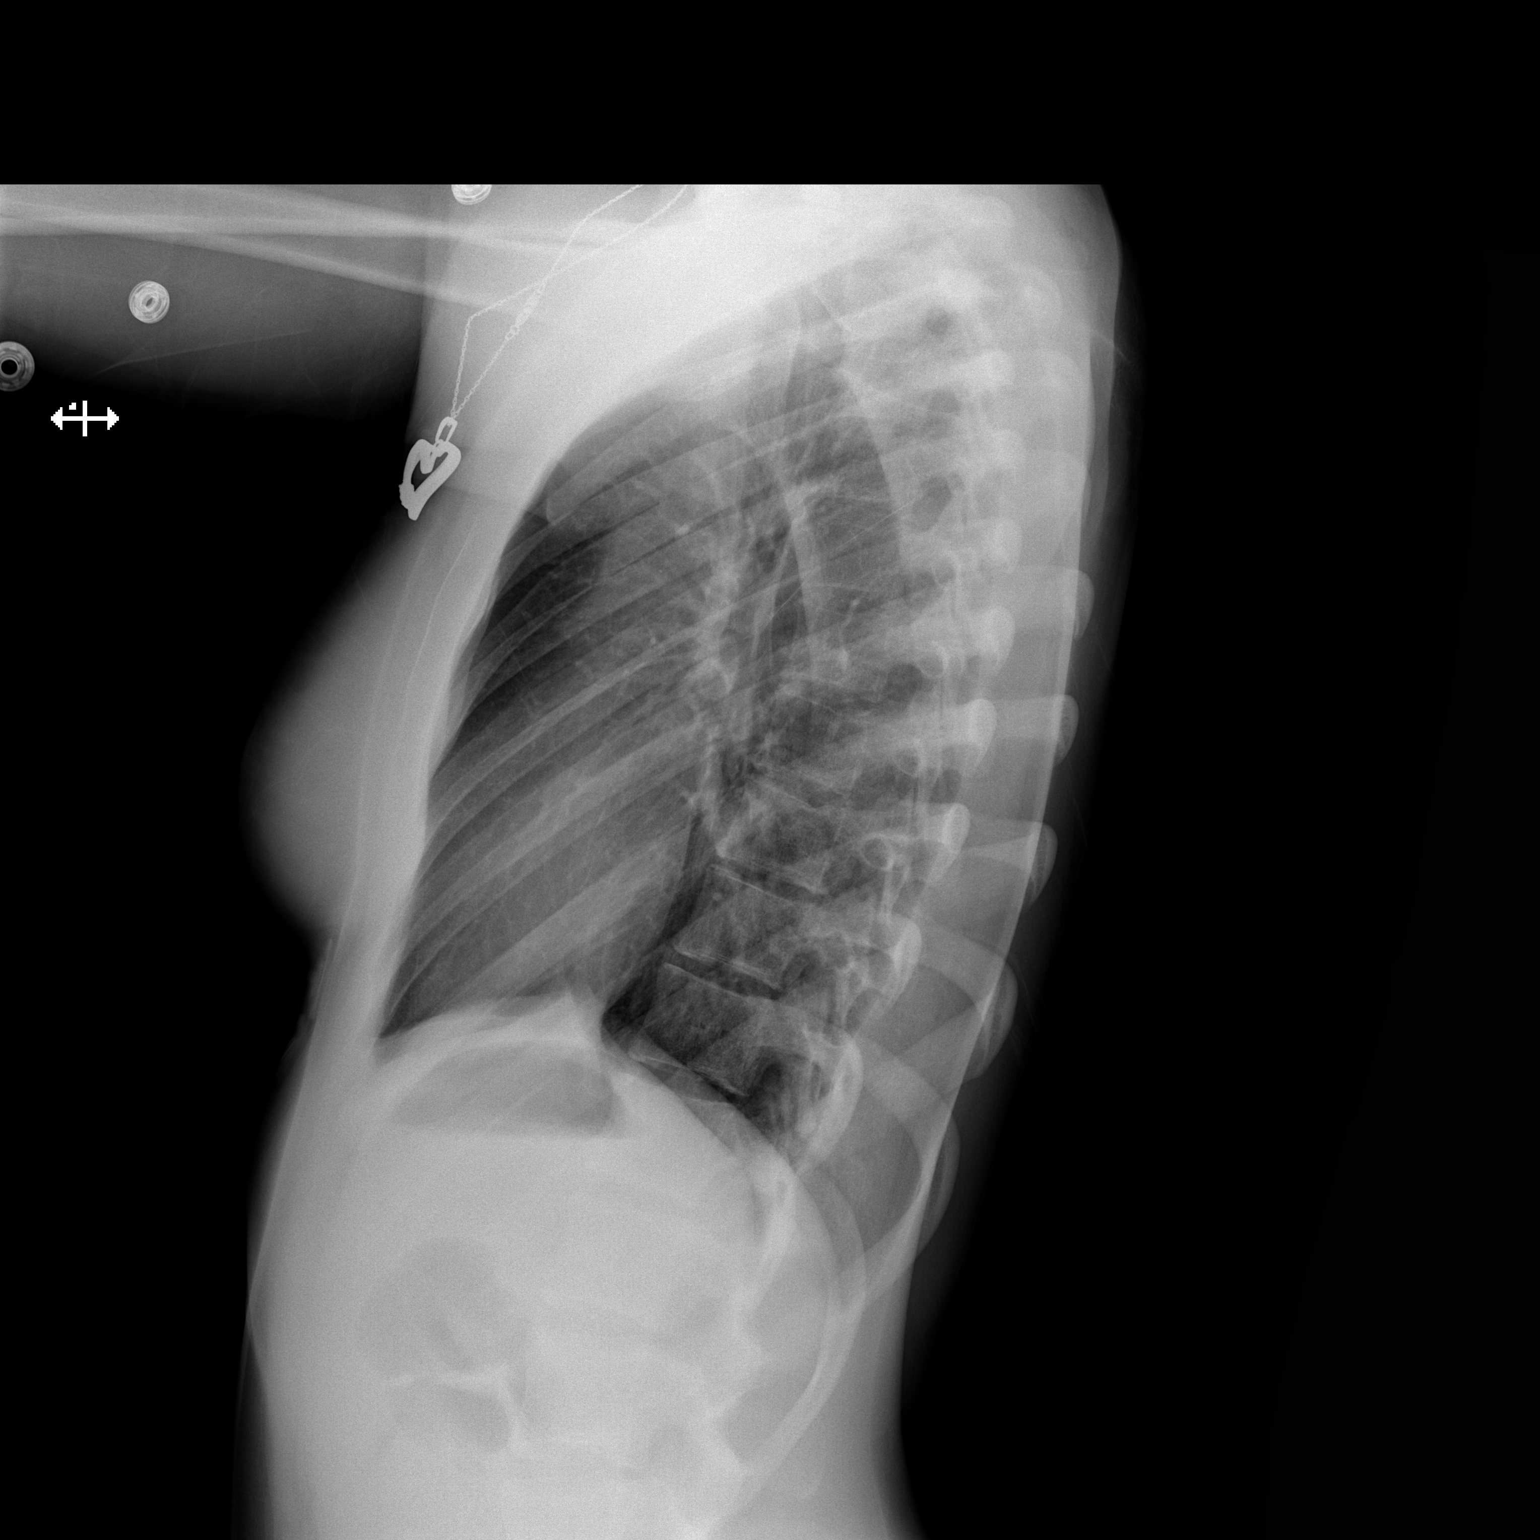

[2 of 2 positions shown; findings below may reference images not displayed]

FINDINGS: The heart size and mediastinal contours are within normal limits.
Both lungs are clear. The visualized skeletal structures are
unremarkable.
IMPRESSION: No active cardiopulmonary disease.

## 2018-04-30 IMAGING — RF DG C-ARM 61-120 MIN
1 series · 1 of 1 positions shown · non-contrast
Comparison: Pelvis 03/04/2015

CLINICAL DATA: Hardware removal

EXAM:
DG C-ARM 61-120 MIN; PELVIS - 1-2 VIEW

[Series 1: run · 1 of 1 slices shown]
[im 1/1]
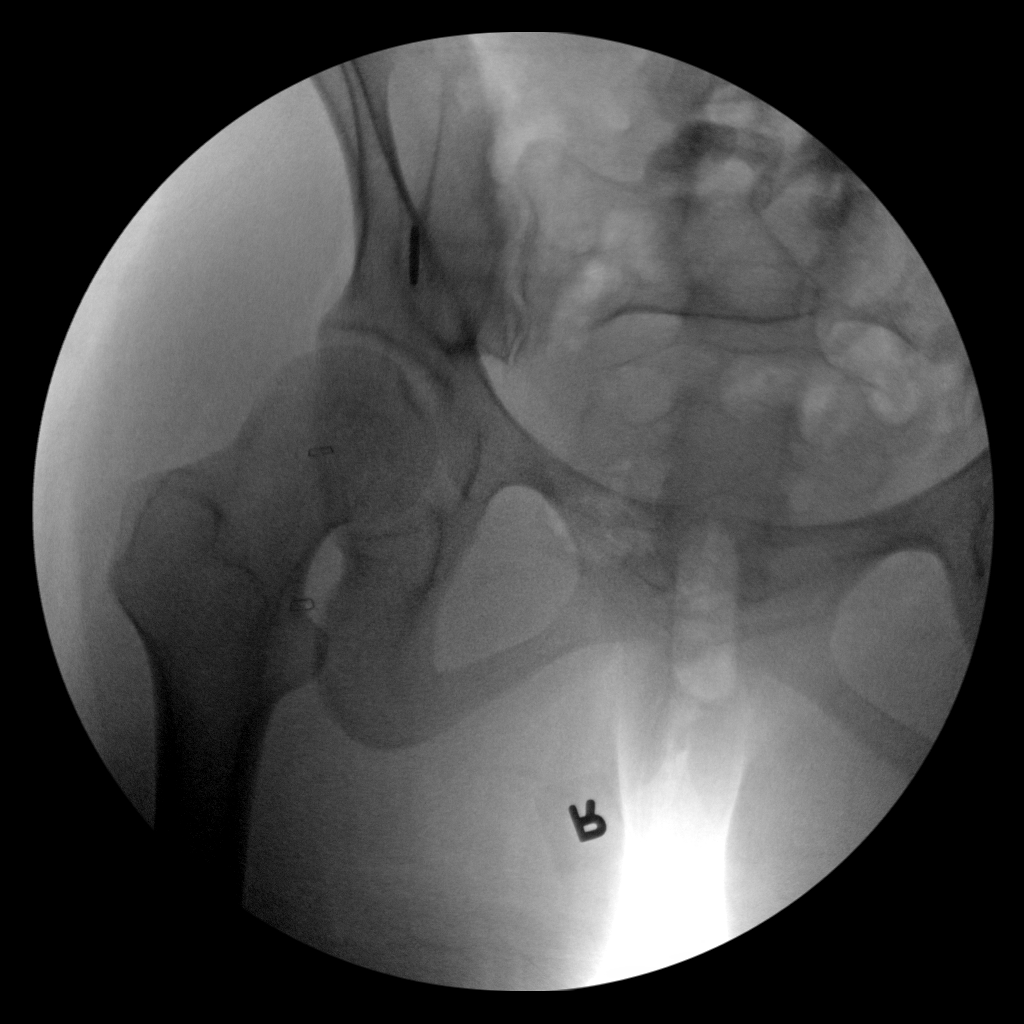

[1 of 1 positions shown; findings below may reference images not displayed]

FINDINGS: A single C-arm image of the right hemipelvis is submitted.
Transverse screw through the iliac bone, SI joint and into the
sacrum has been removed. There remains a metal washer in place.
IMPRESSION: Limited study. There appears to be a metal washer endplate. The SI
joint fusion screw has been removed. Follow-up radiographs
suggested.
